# Patient Record
Sex: Male | Born: 1960 | Race: White | Hispanic: No | Marital: Married | Smoking: Never smoker
Health system: Southern US, Community
[De-identification: ages and names within clinical notes are randomized; demographics above are authoritative.]

---

## 2015-02-08 ENCOUNTER — Ambulatory Visit (INDEPENDENT_AMBULATORY_CARE_PROVIDER_SITE_OTHER): Payer: BLUE CROSS/BLUE SHIELD | Admitting: Family Medicine

## 2015-02-08 ENCOUNTER — Encounter: Payer: Self-pay | Admitting: Family Medicine

## 2015-02-08 ENCOUNTER — Ambulatory Visit (INDEPENDENT_AMBULATORY_CARE_PROVIDER_SITE_OTHER): Payer: BLUE CROSS/BLUE SHIELD

## 2015-02-08 ENCOUNTER — Encounter (INDEPENDENT_AMBULATORY_CARE_PROVIDER_SITE_OTHER): Payer: Self-pay

## 2015-02-08 VITALS — BP 178/116 | HR 87 | Ht 70.0 in | Wt 262.0 lb

## 2015-02-08 DIAGNOSIS — M545 Low back pain: Secondary | ICD-10-CM

## 2015-02-08 DIAGNOSIS — M5137 Other intervertebral disc degeneration, lumbosacral region: Secondary | ICD-10-CM

## 2015-02-08 DIAGNOSIS — I10 Essential (primary) hypertension: Secondary | ICD-10-CM | POA: Insufficient documentation

## 2015-02-08 MED ORDER — METHOCARBAMOL 500 MG PO TABS: 500.0000 mg | ORAL_TABLET | Freq: Three times a day (TID) | ORAL | Status: AC | PRN

## 2015-02-08 NOTE — Patient Instructions (Addendum)
Someone from our office will call you tomorrow regarding the results of your x-rays.  DASH Eating Plan DASH stands for "Dietary Approaches to Stop Hypertension." The DASH eating plan is a healthy eating plan that has been shown to reduce high blood pressure (hypertension). Additional health benefits may include reducing the risk of type 2 diabetes mellitus, heart disease, and stroke. The DASH eating plan may also help with weight loss. WHAT DO I NEED TO KNOW ABOUT THE DASH EATING PLAN? For the DASH eating plan, you will follow these general guidelines:  Choose foods with a percent daily value for sodium of less than 5% (as listed on the food label).  Use salt-free seasonings or herbs instead of table salt or sea salt.  Check with your health care provider or pharmacist before using salt substitutes.  Eat lower-sodium products, often labeled as "lower sodium" or "no salt added."  Eat fresh foods.  Eat more vegetables, fruits, and low-fat dairy products.  Choose whole grains. Look for the word "whole" as the first word in the ingredient list.  Choose fish and skinless chicken or [place] more often than red meat. Limit fish, poultry, and meat to 6 oz (170 g) each day.  Limit sweets, desserts, sugars, and sugary drinks.  Choose heart-healthy fats.  Limit cheese to 1 oz (28 g) per day.  Eat more home-cooked food and less restaurant, buffet, and fast food.  Limit fried foods.  Cook foods using methods other than frying.  Limit canned vegetables. If you do use them, rinse them well to decrease the sodium.  When eating at a restaurant, ask that your food be prepared with less salt, or no salt if possible. WHAT FOODS CAN I EAT? Seek help from a dietitian for individual calorie needs. Grains Whole grain or whole wheat bread. Brown rice. Whole grain or whole wheat pasta. Quinoa, bulgur, and whole grain cereals. Low-sodium cereals. Corn or whole wheat flour tortillas. Whole grain  cornbread. Whole grain crackers. Low-sodium crackers. Vegetables Fresh or frozen vegetables (raw, steamed, roasted, or grilled). Low-sodium or reduced-sodium tomato and vegetable juices. Low-sodium or reduced-sodium tomato sauce and paste. Low-sodium or reduced-sodium canned vegetables.  Fruits All fresh, canned (in natural juice), or frozen fruits. Meat and Other Protein Products Ground beef (85% or leaner), grass-fed beef, or beef trimmed of fat. Skinless chicken or [place]. Ground chicken or [place]. Pork trimmed of fat. All fish and seafood. Eggs. Dried beans, peas, or lentils. Unsalted nuts and seeds. Unsalted canned beans. Dairy Low-fat dairy products, such as skim or 1% milk, 2% or reduced-fat cheeses, low-fat ricotta or cottage cheese, or plain low-fat yogurt. Low-sodium or reduced-sodium cheeses. Fats and Oils Tub margarines without trans fats. Light or reduced-fat mayonnaise and salad dressings (reduced sodium). Avocado. Safflower, olive, or canola oils. Natural peanut or almond butter. Other Unsalted popcorn and pretzels. The items listed above may not be a complete list of recommended foods or beverages. Contact your dietitian for more options. WHAT FOODS ARE NOT RECOMMENDED? Grains White bread. White pasta. White rice. Refined cornbread. Bagels and croissants. Crackers that contain trans fat. Vegetables Creamed or fried vegetables. Vegetables in a cheese sauce. Regular canned vegetables. Regular canned tomato sauce and paste. Regular tomato and vegetable juices. Fruits Dried fruits. Canned fruit in light or heavy syrup. Fruit juice. Meat and Other Protein Products Fatty cuts of meat. Ribs, chicken wings, bacon, sausage, bologna, salami, chitterlings, fatback, hot dogs, bratwurst, and packaged luncheon meats. Salted nuts and seeds. Canned beans with salt.  Dairy Whole or 2% milk, cream, half-and-half, and cream cheese. Whole-fat or sweetened yogurt. Full-fat cheeses or blue cheese.  Nondairy creamers and whipped toppings. Processed cheese, cheese spreads, or cheese curds. Condiments Onion and garlic salt, seasoned salt, table salt, and sea salt. Canned and packaged gravies. Worcestershire sauce. Tartar sauce. Barbecue sauce. Teriyaki sauce. Soy sauce, including reduced sodium. Steak sauce. Fish sauce. Oyster sauce. Cocktail sauce. Horseradish. Ketchup and mustard. Meat flavorings and tenderizers. Bouillon cubes. Hot sauce. Tabasco sauce. Marinades. Taco seasonings. Relishes. Fats and Oils Butter, stick margarine, lard, shortening, ghee, and bacon fat. Coconut, palm kernel, or palm oils. Regular salad dressings. Other Pickles and olives. Salted popcorn and pretzels. The items listed above may not be a complete list of foods and beverages to avoid. Contact your dietitian for more information. WHERE CAN I FIND MORE INFORMATION? National Heart, Lung, and Blood Institute: travelstabloid.com Document Released: 11/14/2011 Document Revised: 04/11/2014 Document Reviewed: 09/29/2013 [facility] Patient Information 2015 [place], [place]. This information is not intended to replace advice given to you by your health care provider. Make sure you discuss any questions you have with your health care provider.

## 2015-02-08 NOTE — Progress Notes (Signed)
CC: Travis Hancock is a 54 y.o. male is here for Establish Care   Subjective: HPI:  Very pleasant 54 year old here to establish care  Complains of low back pain that has been present for 3 weeks. After 2 weeks of slowly was getting better however over the past 4 days it's been getting worse for reasons unknown to him. He's never had this before. He denies any overexertion trauma or precipitating event that came on before the pain. The pain came on gradually and overall is persistent now moderate in severity. Symptoms are present throughout the day but greatly temporarily improve with ibuprofen for about 6 hours. The pain will wake him up at night every night of the week. He has tried stretching his lower back abdomen and gluteal muscles but has not had any relief overall. Denies midline back pain, radiation to the legs, weakness of the legs, abdominal pain, saddle paresthesia nor bowel or bladder incontinence   Review of Systems - General ROS: negative for - chills, fever, night sweats, weight gain or weight loss Ophthalmic ROS: negative for - decreased vision Psychological ROS: negative for - anxiety or depression ENT ROS: negative for - hearing change, nasal congestion, tinnitus or allergies Hematological and Lymphatic ROS: negative for - bleeding problems, bruising or swollen lymph nodes Breast ROS: negative Respiratory ROS: no cough, shortness of breath, or wheezing Cardiovascular ROS: no chest pain or dyspnea on exertion Gastrointestinal ROS: no abdominal pain, change in bowel habits, or black or bloody stools Genito-Urinary ROS: negative for - genital discharge, genital ulcers, incontinence or abnormal bleeding from genitals Musculoskeletal ROS: negative for - joint pain or muscle pain other than that described above Neurological ROS: negative for - headaches or memory loss Dermatological ROS: negative for lumps, mole changes, rash and skin lesion changes  History reviewed. No pertinent  past medical history.  History reviewed. No pertinent past surgical history. History reviewed. No pertinent family history.  History   Social History  . Marital Status: Married    Spouse Name: N/A  . Number of Children: N/A  . Years of Education: N/A   Occupational History  . Not on file.   Social History Main Topics  . Smoking status: Never Smoker   . Smokeless tobacco: Not on file  . Alcohol Use: 0.6 oz/week    1 Standard drinks or equivalent per week  . Drug Use: No  . Sexual Activity:    Partners: Female   Other Topics Concern  . Not on file   Social History Narrative  . No narrative on file     Objective: BP 178/116 mmHg  Pulse 87  Ht 5\' 10"  (1.778 m)  Wt 262 lb (118.842 kg)  BMI 37.59 kg/m2  General: Alert and Oriented, No Acute Distress HEENT: Pupils equal, round, reactive to light. Conjunctivae clear.  Moist weakness mucous membranes Lungs: Clear to auscultation bilaterally, no wheezing/ronchi/rales.  Comfortable work of breathing. Good air movement. Cardiac: Regular rate and rhythm. Normal S1/S2.  No murmurs, rubs, nor gallops.   Abdomen: Obese and soft Back: No midline spinous process tenderness, no palpable abnormality or precipitation of pain with palpation of his paraspinal musculature in the lumbar region. He localizes pain to just the right of L2 and L3. Straight leg raise is negative bilaterally and FABER negative bilaterally. Gait normal Extremities: No peripheral edema.  Strong peripheral pulses.  Mental Status: No depression, anxiety, nor agitation. Skin: Warm and dry.  Assessment & Plan: Travis Hancock was seen today for establish care.  Diagnoses and all orders for this visit:  Essential hypertension Orders: -     DG Lumbar Spine Complete; Future  Bilateral low back pain, with sciatica presence unspecified Orders: -     methocarbamol (ROBAXIN) 500 MG tablet; Take 1 tablet (500 mg total) by mouth every 8 (eight) hours as needed for muscle  spasms.   Essential hypertension: Discussed the DASH diet to help lower blood pressure, he is not aware blood pressure being elevated in the past we'll request outside records to see if this is true, anticipate follow-up in one month pending results of his x-ray Low back pain: Suspect muscular strain however concerned that he is waking up because of his pain, further evaluation with x-rays warranted. Begin Robaxin and continue ibuprofen. Ultimate plan will be based on the above results  Return if symptoms worsen or fail to improve.

## 2015-02-16 ENCOUNTER — Encounter: Payer: Self-pay | Admitting: Family Medicine

## 2015-02-16 DIAGNOSIS — N200 Calculus of kidney: Secondary | ICD-10-CM | POA: Insufficient documentation

## 2015-02-16 DIAGNOSIS — Z860100 Personal history of colon polyps, unspecified: Secondary | ICD-10-CM | POA: Insufficient documentation

## 2015-02-16 DIAGNOSIS — K760 Fatty (change of) liver, not elsewhere classified: Secondary | ICD-10-CM | POA: Insufficient documentation

## 2015-03-30 ENCOUNTER — Emergency Department (INDEPENDENT_AMBULATORY_CARE_PROVIDER_SITE_OTHER): Payer: BLUE CROSS/BLUE SHIELD

## 2015-03-30 ENCOUNTER — Emergency Department
Admission: EM | Admit: 2015-03-30 | Discharge: 2015-03-30 | Disposition: A | Discharge status: Home / Self Care | Payer: BLUE CROSS/BLUE SHIELD | Source: Home / Self Care | Attending: Emergency Medicine | Admitting: Emergency Medicine

## 2015-03-30 ENCOUNTER — Encounter: Payer: Self-pay | Admitting: *Deleted

## 2015-03-30 DIAGNOSIS — M25512 Pain in left shoulder: Secondary | ICD-10-CM

## 2015-03-30 DIAGNOSIS — S40012A Contusion of left shoulder, initial encounter: Secondary | ICD-10-CM

## 2015-03-30 DIAGNOSIS — S46912A Strain of unspecified muscle, fascia and tendon at shoulder and upper arm level, left arm, initial encounter: Secondary | ICD-10-CM | POA: Diagnosis not present

## 2015-03-30 MED ORDER — MELOXICAM 7.5 MG PO TABS: ORAL_TABLET | ORAL | Status: DC

## 2015-03-30 MED ORDER — HYDROCODONE-ACETAMINOPHEN 5-325 MG PO TABS: 1.0000 | ORAL_TABLET | Freq: Four times a day (QID) | ORAL | Status: DC | PRN

## 2015-03-30 MED ORDER — PREDNISONE 20 MG PO TABS: 20.0000 mg | ORAL_TABLET | Freq: Two times a day (BID) | ORAL | Status: DC
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
## 2015-04-24 ENCOUNTER — Encounter: Payer: Self-pay | Admitting: Sports Medicine

## 2015-04-24 ENCOUNTER — Ambulatory Visit (INDEPENDENT_AMBULATORY_CARE_PROVIDER_SITE_OTHER): Payer: BLUE CROSS/BLUE SHIELD | Admitting: Sports Medicine

## 2015-04-24 ENCOUNTER — Ambulatory Visit (INDEPENDENT_AMBULATORY_CARE_PROVIDER_SITE_OTHER): Payer: BLUE CROSS/BLUE SHIELD

## 2015-04-24 VITALS — BP 154/97 | HR 97 | Ht 70.0 in | Wt 258.0 lb

## 2015-04-24 DIAGNOSIS — M542 Cervicalgia: Secondary | ICD-10-CM | POA: Diagnosis not present

## 2015-04-24 DIAGNOSIS — M5412 Radiculopathy, cervical region: Secondary | ICD-10-CM

## 2015-04-24 MED ORDER — MELOXICAM 15 MG PO TABS: ORAL_TABLET | ORAL | Status: AC

## 2015-04-24 NOTE — Assessment & Plan Note (Signed)
Left C6 and C7. Formal physical therapy, in [facility]. Meloxicam. X-rays. Return to see me in 4-6 weeks, MRI for intervention if no better.

## 2015-04-24 NOTE — Progress Notes (Signed)
   Subjective:    I'm seeing this patient as a consultation for:  Dr. Ivan AnchorsHommel  CC: Left arm pain  HPI: For the past several weeks this pleasant 54 year old male has had numbness and tingling going down his left arm and a C6-C7 distribution, worse with turning his head to the left. Moderate, persistent without constitutional symptoms, no lower extremity symptoms. No bowel or bladder dysfunction.  Past medical history, Surgical history, Family history not pertinant except as noted below, Social history, Allergies, and medications have been entered into the medical record, reviewed, and no changes needed.   Review of Systems: No headache, visual changes, nausea, vomiting, diarrhea, constipation, dizziness, abdominal pain, skin rash, fevers, chills, night sweats, weight loss, swollen lymph nodes, body aches, joint swelling, muscle aches, chest pain, shortness of breath, mood changes, visual or auditory hallucinations.   Objective:   General: Well Developed, well nourished, and in no acute distress.  Neuro/Psych: Alert and oriented x3, extra-ocular muscles intact, able to move all 4 extremities, sensation grossly intact. Skin: Warm and dry, no rashes noted.  Respiratory: Not using accessory muscles, speaking in full sentences, trachea midline.  Cardiovascular: Pulses palpable, no extremity edema. Abdomen: Does not appear distended. Neck: Positive spurling's with left rotation Full neck range of motion Grip strength and sensation normal in bilateral hands Strength good C4 to T1 distribution No sensory change to C4 to T1 Reflexes hypoactive on the left to both brachial radialis and biceps.  Cervical spine x-ray show multilevel degenerative disease.  Impression and Recommendations:   This case required medical decision making of moderate complexity.

## 2015-05-24 ENCOUNTER — Ambulatory Visit (INDEPENDENT_AMBULATORY_CARE_PROVIDER_SITE_OTHER): Payer: BLUE CROSS/BLUE SHIELD | Admitting: Sports Medicine

## 2015-05-24 ENCOUNTER — Encounter: Payer: Self-pay | Admitting: Sports Medicine

## 2015-05-24 DIAGNOSIS — M5412 Radiculopathy, cervical region: Secondary | ICD-10-CM | POA: Diagnosis not present

## 2015-05-24 NOTE — Assessment & Plan Note (Addendum)
Did extremely well after 6 weeks of physician directed physical therapy. 100% resolution of axial pain, with mild persistence of left C5 and C6 radiculopathy. At this time we are going to obtain an MRI for interventional planning. He needs the MRI on Thursday or Monday. If the MRI is Thursday we can try to set him up for an epidural on Monday. He goes back to [facility] at the end of Monday.  We're only able to schedule his MRI for Monday, his epidural should take place in the left C6-C7 level with medication advanced up to the C5-C6 level, we will try to get this done on Monday as well, his MRI will be in the chart by then.

## 2015-05-24 NOTE — Progress Notes (Signed)
  Subjective:    CC: Follow-up  HPI: Left cervical radiculopathy: C5 and C6 distribution, did extremely well with physical therapy, please resolution of all axial pain, still has mild radicular pain that seems to have plateaued with therapy. Symptoms are mild, persistent.  Past medical history, Surgical history, Family history not pertinant except as noted below, Social history, Allergies, and medications have been entered into the medical record, reviewed, and no changes needed.   Review of Systems: No fevers, chills, night sweats, weight loss, chest pain, or shortness of breath.   Objective:    General: Well Developed, well nourished, and in no acute distress.  Neuro: Alert and oriented x3, extra-ocular muscles intact, sensation grossly intact.  HEENT: Normocephalic, atraumatic, pupils equal round reactive to light, neck supple, no masses, no lymphadenopathy, thyroid nonpalpable.  Skin: Warm and dry, no rashes. Cardiac: Regular rate and rhythm, no murmurs rubs or gallops, no lower extremity edema.  Respiratory: Clear to auscultation bilaterally. Not using accessory muscles, speaking in full sentences. Neck: Negative spurling's Full neck range of motion Grip strength and sensation normal in bilateral hands Strength good C4 to T1 distribution No sensory change to C4 to T1 Reflexes normal  Impression and Recommendations:

## 2015-05-29 ENCOUNTER — Ambulatory Visit
Admission: RE | Admit: 2015-05-29 | Discharge: 2015-05-29 | Disposition: A | Discharge status: Home / Self Care | Payer: BLUE CROSS/BLUE SHIELD | Source: Ambulatory Visit | Attending: Sports Medicine | Admitting: Sports Medicine

## 2015-05-29 ENCOUNTER — Ambulatory Visit (INDEPENDENT_AMBULATORY_CARE_PROVIDER_SITE_OTHER): Payer: BLUE CROSS/BLUE SHIELD

## 2015-05-29 ENCOUNTER — Ambulatory Visit: Payer: BLUE CROSS/BLUE SHIELD | Admitting: Sports Medicine

## 2015-05-29 DIAGNOSIS — M5032 Other cervical disc degeneration, mid-cervical region: Secondary | ICD-10-CM | POA: Diagnosis not present

## 2015-05-29 DIAGNOSIS — M5031 Other cervical disc degeneration,  high cervical region: Secondary | ICD-10-CM

## 2015-05-29 DIAGNOSIS — M47892 Other spondylosis, cervical region: Secondary | ICD-10-CM

## 2015-05-29 DIAGNOSIS — M5412 Radiculopathy, cervical region: Secondary | ICD-10-CM

## 2015-05-29 MED ORDER — IOHEXOL 300 MG/ML  SOLN
1.0000 mL | Freq: Once | INTRAMUSCULAR | Status: AC | PRN
  Administered 2015-05-29: 1 mL via EPIDURAL

## 2015-05-29 MED ORDER — TRIAMCINOLONE ACETONIDE 40 MG/ML IJ SUSP (RADIOLOGY)
60.0000 mg | Freq: Once | INTRAMUSCULAR | Status: AC
  Administered 2015-05-29: 60 mg via EPIDURAL

## 2015-05-29 NOTE — Discharge Instructions (Signed)

## 2015-10-17 ENCOUNTER — Emergency Department
Admission: EM | Admit: 2015-10-17 | Discharge: 2015-10-17 | Disposition: A | Discharge status: Home / Self Care | Payer: BLUE CROSS/BLUE SHIELD | Source: Home / Self Care | Attending: Family Medicine | Admitting: Family Medicine

## 2015-10-17 ENCOUNTER — Encounter: Payer: Self-pay | Admitting: Emergency Medicine

## 2015-10-17 ENCOUNTER — Emergency Department (INDEPENDENT_AMBULATORY_CARE_PROVIDER_SITE_OTHER): Payer: BLUE CROSS/BLUE SHIELD

## 2015-10-17 DIAGNOSIS — X58XXXA Exposure to other specified factors, initial encounter: Secondary | ICD-10-CM | POA: Diagnosis not present

## 2015-10-17 DIAGNOSIS — S91111A Laceration without foreign body of right great toe without damage to nail, initial encounter: Secondary | ICD-10-CM | POA: Diagnosis not present

## 2015-10-17 DIAGNOSIS — S92421A Displaced fracture of distal phalanx of right great toe, initial encounter for closed fracture: Secondary | ICD-10-CM | POA: Diagnosis not present

## 2015-10-17 DIAGNOSIS — S92401A Displaced unspecified fracture of right great toe, initial encounter for closed fracture: Secondary | ICD-10-CM | POA: Diagnosis not present

## 2015-10-17 MED ORDER — DOXYCYCLINE HYCLATE 100 MG PO CAPS: 100.0000 mg | ORAL_CAPSULE | Freq: Two times a day (BID) | ORAL | Status: AC

## 2015-10-17 MED ORDER — HYDROCODONE-ACETAMINOPHEN 5-325 MG PO TABS: 1.0000 | ORAL_TABLET | ORAL | Status: AC | PRN

## 2015-10-17 NOTE — Discharge Instructions (Signed)
Elevate foot as much as possible.  Leave bandage in place until follow-up visit tomorrow.  May take Ibuprofen 200mg , 4 tabs every 8 hours with food.   Return in 10 days for suture removal.  Return for any signs of infection (or follow-up with family doctor):  Increasing redness, swelling, pain, heat, drainage, etc.

## 2015-10-17 NOTE — ED Notes (Signed)
Patient slipped on stairs and cut right great toe

## 2015-10-17 NOTE — ED Provider Notes (Signed)
CSN: [id number]     Arrival date & time 10/17/15  1053 History   First MD Initiated Contact with Patient 10/17/15 1123     Chief Complaint  Patient presents with  . Toe Injury     HPI Comments: Patient slipped on stairs three hours ago, injuring and lacerating his right great toe.  He does not remember his last Tdap.  Patient is a 54 y.o. male presenting with skin laceration. The history is provided by the patient.  Laceration Location:  Toe Toe laceration location:  R great toe Length (cm):  2 Depth:  Through underlying tissue Quality: straight   Bleeding: controlled   Time since incident:  3 hours Injury mechanism: tripped on carpet on stair. Pain details:    Quality:  Aching   Severity:  Moderate   Timing:  Constant   Progression:  Unchanged Foreign body present:  No foreign bodies Relieved by:  Nothing Worsened by:  Movement Tetanus status:  Out of date   History reviewed. No pertinent past medical history. History reviewed. No pertinent past surgical history. No family history on file. Social History  Substance Use Topics  . Smoking status: Never Smoker   . Smokeless tobacco: None  . Alcohol Use: 0.6 oz/week    1 Standard drinks or equivalent per week    Review of Systems  All other systems reviewed and are negative.   Allergies  E-mycin and Penicillins  Home Medications   Prior to Admission medications   Medication Sig Start Date End Date Taking? Authorizing Provider  aspirin 81 MG tablet Take 81 mg by mouth daily.    Historical Provider, MD  doxycycline (VIBRAMYCIN) 100 MG capsule Take 1 capsule (100 mg total) by mouth 2 (two) times daily. Take with food. 10/17/15   Lattie Haw, MD  HYDROcodone-acetaminophen (NORCO/VICODIN) 5-325 MG tablet Take 1 tablet by mouth every 4 (four) hours as needed for moderate pain. 10/17/15   Lattie Haw, MD  loratadine (CLARITIN) 10 MG tablet Take 10 mg by mouth daily.    Historical Provider, MD  meloxicam (MOBIC) 15  MG tablet One tab PO qAM with breakfast for 2 weeks, then daily prn pain. 04/24/15   Monica Becton, MD  methocarbamol (ROBAXIN) 500 MG tablet Take 1 tablet (500 mg total) by mouth every 8 (eight) hours as needed for muscle spasms. 02/08/15   Laren Boom, DO  Omeprazole (PRILOSEC PO) Take by mouth.    Historical Provider, MD   Meds Ordered and Administered this Visit  Medications - No data to display  BP 152/95 mmHg  Pulse 103  Temp(Src) 98.3 F (36.8 C) (Oral)  Ht 6' (1.829 m)  Wt 250 lb (113.399 kg)  BMI 33.90 kg/m2  SpO2 95% No data found.   Physical Exam  Constitutional: He is oriented to person, place, and time. He appears well-developed and well-nourished. No distress.  HENT:  Head: Atraumatic.  Eyes: Pupils are equal, round, and reactive to light.  Pulmonary/Chest: No respiratory distress.  Musculoskeletal:       Right foot: There is tenderness, swelling and laceration. There is normal capillary refill and no deformity.       Feet:  Left great toe has a 2cm laceration along the medial edge of the toenail, extending about one cm proximal to the nail.  Nail had subungual hematoma.  Distal sensation intact.  Toe range of motion intact.  Neurological: He is alert and oriented to person, place, and time.  Skin: Skin  is warm and dry.  Nursing note and vitals reviewed.   ED Course  Procedures  Laceration Repair Discussed benefits and risks of procedure and verbal consent obtained. Using sterile technique and digital 2% bupivacaine without epinephrine, cleansed wound with Betadine followed by copious lavage with normal saline.  Wound carefully inspected for debris and foreign bodies; none found.  Wound closed with #6, 4-0 interrupted nylon sutures.  Nail trephination performed to permit drainage of sub-ungual hematoma.  Xeroform dressing applied, followed by wrap with Kerlix gauze and Coban.  Wound precautions explained to patient.  Return for suture removal in 10 days.     Imaging Review Dg Toe Great Right  10/17/2015  CLINICAL DATA:  Contusion and laceration the the distal phalanx of the great toe. EXAM: RIGHT GREAT TOE COMPARISON:  None. FINDINGS: The distal aspect the great toes is obscured by overlying dressing. There is a fracture of the tuft with minimal displacement. No definite foreign body seen. A lateral view was not performed. Degenerative changes are present at the first MTP joint. IMPRESSION: Fracture through the tuft of the distal phalanx with minimal displacement. Electronically Signed   By: Marin Robertshristopher  Mattern M.D.   On: 10/17/2015 13:41       MDM   1. Fractured great toe, right, closed, initial encounter   2. Laceration of right great toe, initial encounter      Tdap administered.  Toradol 60mg  IM Begin empiric doxycycline 100mg  BID.  Rx for Lortab. Dispensed post-op shoe Elevate foot as much as possible.  Leave bandage in place until follow-up visit tomorrow.  May take Ibuprofen 200mg , 4 tabs every 8 hours with food.   Return in 10 days for suture removal.  Return for any signs of infection (or follow-up with family doctor):  Increasing redness, swelling, pain, heat, drainage, etc.     Lattie HawStephen A Kate Larock, MD 10/17/15 1359

## 2015-10-18 ENCOUNTER — Emergency Department (INDEPENDENT_AMBULATORY_CARE_PROVIDER_SITE_OTHER)
Admission: EM | Admit: 2015-10-18 | Discharge: 2015-10-18 | Disposition: A | Discharge status: Home / Self Care | Payer: BLUE CROSS/BLUE SHIELD | Source: Home / Self Care | Attending: Family Medicine | Admitting: Family Medicine

## 2015-10-18 ENCOUNTER — Encounter: Payer: Self-pay | Admitting: *Deleted

## 2015-10-18 DIAGNOSIS — Z5189 Encounter for other specified aftercare: Secondary | ICD-10-CM

## 2015-10-18 NOTE — ED Provider Notes (Signed)
CSN: [id number]     Arrival date & time 10/18/15  1048 History   First MD Initiated Contact with Patient 10/18/15 1103     Chief Complaint  Patient presents with  . Wound Check   (Consider location/radiation/quality/duration/timing/severity/associated sxs/prior Treatment) HPI Pt is a 54yo male [facility] wound on Right great toe.  [facility] and had 6 sutures placed for an open Right great toe fracture.  He has been wearing a post-op shoe and taking his antibiotic and pain medication as prescribed.  Pain woke him this morning around 3AM but has improved to 3/10 after taking his pain medication.  Pain is also better when his foot is elevated.  Pt also notes the wound bled through the bandage last night so he had to change it a few times. Denies any other injuries to the toe.    History reviewed. No pertinent past medical history. History reviewed. No pertinent past surgical history. History reviewed. No pertinent family history. Social History  Substance Use Topics  . Smoking status: Never Smoker   . Smokeless tobacco: None  . Alcohol Use: 0.6 oz/week    1 Standard drinks or equivalent per week    Review of Systems  Musculoskeletal: Positive for myalgias and arthralgias.       Right great toe  Skin: Positive for wound. Negative for color change.  Neurological: Negative for weakness and numbness.    Allergies  E-mycin and Penicillins  Home Medications   Prior to Admission medications   Medication Sig Start Date End Date Taking? Authorizing Provider  aspirin 81 MG tablet Take 81 mg by mouth daily.    Historical Provider, MD  doxycycline (VIBRAMYCIN) 100 MG capsule Take 1 capsule (100 mg total) by mouth 2 (two) times daily. Take with food. 10/17/15   Lattie HawStephen A Beese, MD  HYDROcodone-acetaminophen (NORCO/VICODIN) 5-325 MG tablet Take 1 tablet by mouth every 4 (four) hours as needed for moderate pain. 10/17/15   Lattie HawStephen A Beese, MD   loratadine (CLARITIN) 10 MG tablet Take 10 mg by mouth daily.    Historical Provider, MD  meloxicam (MOBIC) 15 MG tablet One tab PO qAM with breakfast for 2 weeks, then daily prn pain. 04/24/15   Monica Bectonhomas J Thekkekandam, MD  methocarbamol (ROBAXIN) 500 MG tablet Take 1 tablet (500 mg total) by mouth every 8 (eight) hours as needed for muscle spasms. 02/08/15   Laren BoomSean Hommel, DO  Omeprazole (PRILOSEC PO) Take by mouth.    Historical Provider, MD   Meds Ordered and Administered this Visit  Medications - No data to display  BP 158/109 mmHg  Pulse 84  Temp(Src) 98.2 F (36.8 C) (Oral)  Resp 18  SpO2 96% No data found.   Physical Exam  Constitutional: He is oriented to person, place, and time. He appears well-developed and well-nourished.  HENT:  Head: Normocephalic and atraumatic.  Eyes: EOM are normal.  Neck: Normal range of motion.  Cardiovascular: Normal rate.   Pulmonary/Chest: Effort normal.  Musculoskeletal: Normal range of motion. He exhibits edema and tenderness.  Right great toe: full ROM. Diffuse tenderness (see skin exam)  Neurological: He is alert and oriented to person, place, and time.  Skin: Skin is warm and dry.  Right great toe: moderate edema, small amount of dried and red blood at site of wound. #6 interrupted sutures in place. No red streaking.   Psychiatric: He has a normal mood and affect. His behavior is normal.  Nursing note and vitals reviewed.   ED Course  Procedures (including critical care time)  Labs Review Labs Reviewed - No data to display  Imaging Review Dg Toe Great Right  10/17/2015  CLINICAL DATA:  Contusion and laceration the the distal phalanx of the great toe. EXAM: RIGHT GREAT TOE COMPARISON:  None. FINDINGS: The distal aspect the great toes is obscured by overlying dressing. There is a fracture of the tuft with minimal displacement. No definite foreign body seen. A lateral view was not performed. Degenerative changes are present at the first  MTP joint. IMPRESSION: Fracture through the tuft of the distal phalanx with minimal displacement. Electronically Signed   By: Marin Roberts M.D.   On: 10/17/2015 13:41      MDM   1. Encounter for wound re-check    Pt presenting to [facility] for wound recheck of Right great toe, open fracture. Pt appears to be doing well. Edema and scant amount of active bleeding still present. No evidence of underlying infection. Six sutures still in place. Encouraged pt to keep foot elevated as much as possible and continue taking medications as prescribed. F/u in 9 days for suture removal. Return sooner or f/u with PCP if signs of infection. Patient verbalized understanding and agreement with treatment plan.    Junius Finner, PA-C 10/18/15 1158

## 2015-10-18 NOTE — ED Notes (Signed)
Travis Hancock is here today for a recheck of his RT great toe wound from yesterday.

## 2015-10-18 NOTE — Discharge Instructions (Signed)
Continue taking antibiotic and pain medications as prescribed.  Keep foot elevated as much as possible throughout the day and night.  You may now change the bandage twice a day and clean wound gently with soap and water.

## 2017-02-13 IMAGING — CR DG TOE GREAT 2+V*R*
3 series · 3 of 3 positions shown · non-contrast
Comparison: None.

CLINICAL DATA: Contusion and laceration the the distal phalanx of
the great toe.

EXAM:
RIGHT GREAT TOE

[toe ap]
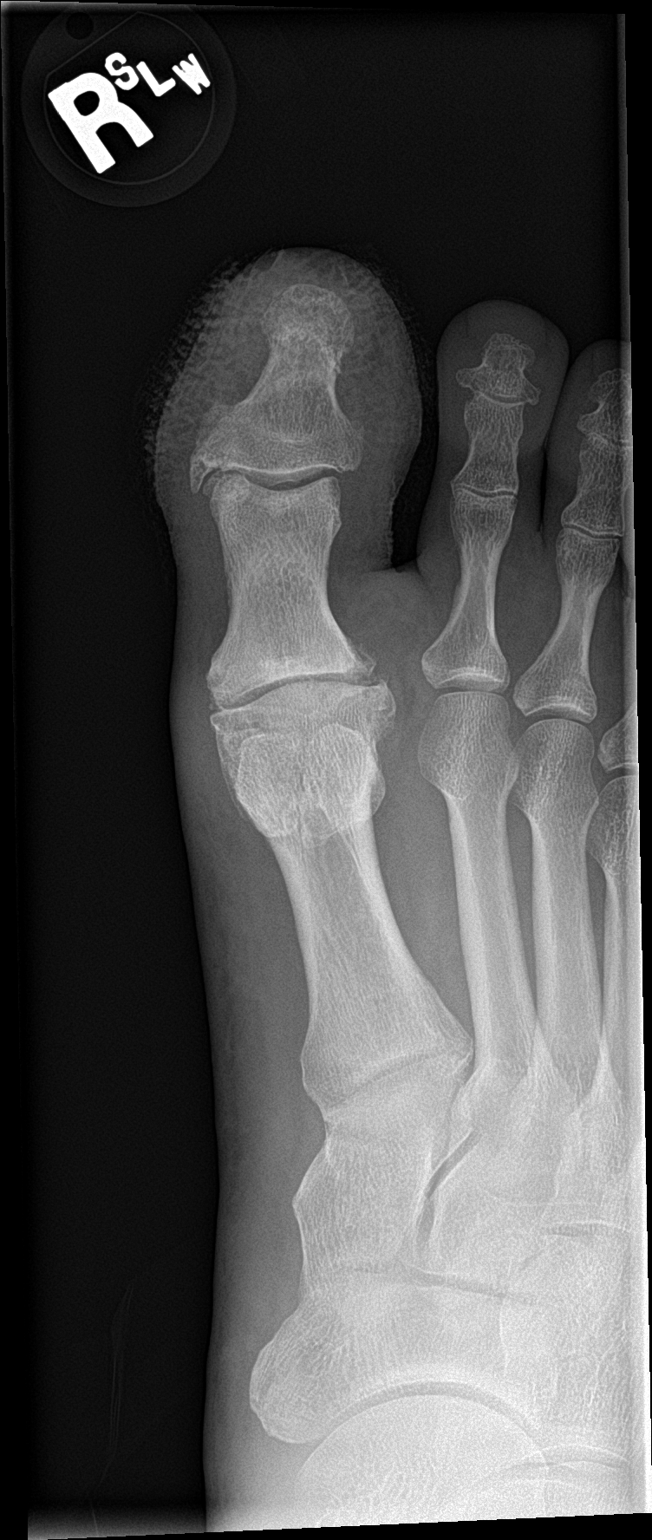

[toe obl]
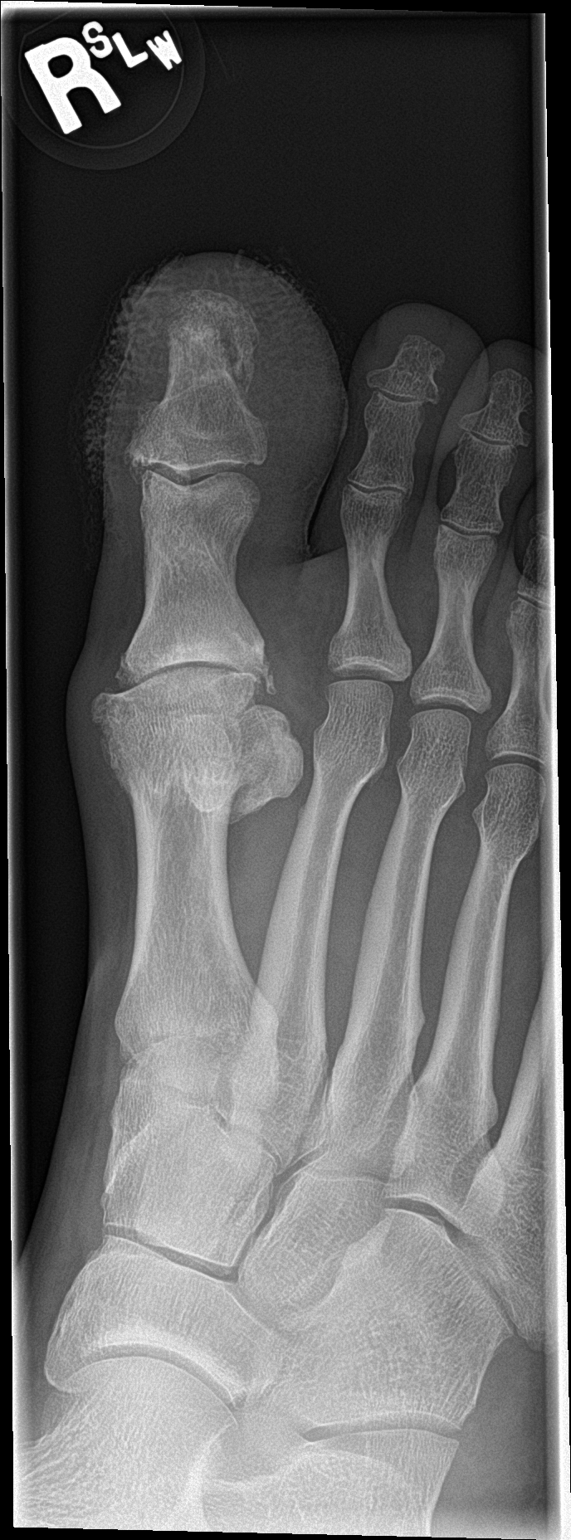

[toe lat]
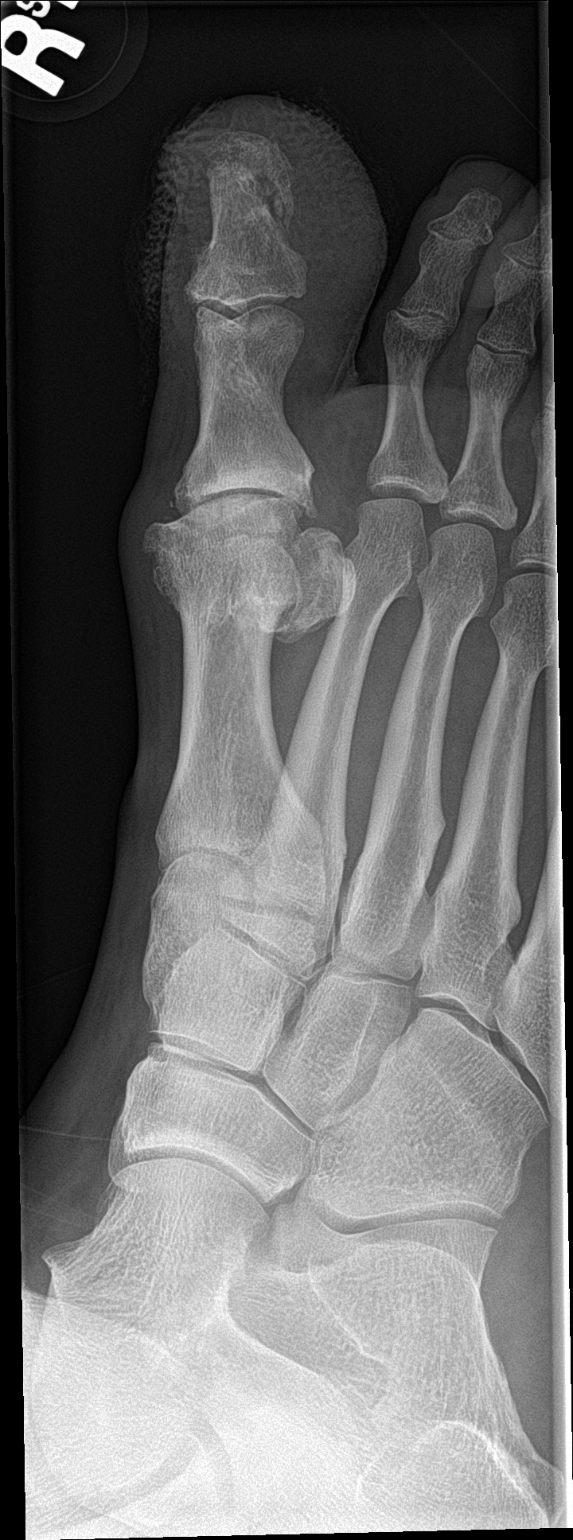

[3 of 3 positions shown; findings below may reference images not displayed]

FINDINGS: The distal aspect the great toes is obscured by overlying dressing.
There is a fracture of the tuft with minimal displacement. No
definite foreign body seen. A lateral view was not performed.

Degenerative changes are present at the first MTP joint.
IMPRESSION: Fracture through the tuft of the distal phalanx with minimal
displacement.
